# Patient Record
Sex: Female | Born: 1965 | Race: White | Hispanic: No | Smoking: Light tobacco smoker
Health system: Southern US, Community
[De-identification: ages and names within clinical notes are randomized; demographics above are authoritative.]

## PROBLEM LIST (undated history)

## (undated) DIAGNOSIS — K219 Gastro-esophageal reflux disease without esophagitis: Secondary | ICD-10-CM

## (undated) DIAGNOSIS — R079 Chest pain, unspecified: Secondary | ICD-10-CM

## (undated) DIAGNOSIS — I10 Essential (primary) hypertension: Secondary | ICD-10-CM

## (undated) DIAGNOSIS — T4145XA Adverse effect of unspecified anesthetic, initial encounter: Secondary | ICD-10-CM

## (undated) DIAGNOSIS — M1990 Unspecified osteoarthritis, unspecified site: Secondary | ICD-10-CM

## (undated) DIAGNOSIS — J449 Chronic obstructive pulmonary disease, unspecified: Secondary | ICD-10-CM

## (undated) DIAGNOSIS — R0602 Shortness of breath: Secondary | ICD-10-CM

## (undated) DIAGNOSIS — I499 Cardiac arrhythmia, unspecified: Secondary | ICD-10-CM

## (undated) DIAGNOSIS — T8859XA Other complications of anesthesia, initial encounter: Secondary | ICD-10-CM

## (undated) HISTORY — PX: JOINT REPLACEMENT: SHX530

## (undated) HISTORY — PX: TONSILLECTOMY: SUR1361

## (undated) HISTORY — PX: TUBAL LIGATION: SHX77

---

## 2006-03-28 HISTORY — PX: ABDOMINAL HYSTERECTOMY: SHX81

## 2007-11-06 ENCOUNTER — Emergency Department (HOSPITAL_COMMUNITY): Admission: EM | Admit: 2007-11-06 | Discharge: 2007-11-06 | Payer: Self-pay | Admitting: *Deleted

## 2008-08-20 ENCOUNTER — Emergency Department (HOSPITAL_COMMUNITY): Admission: EM | Admit: 2008-08-20 | Discharge: 2008-08-21 | Payer: Self-pay | Admitting: Emergency Medicine

## 2010-04-01 ENCOUNTER — Emergency Department (HOSPITAL_COMMUNITY)
Admission: EM | Admit: 2010-04-01 | Discharge: 2010-04-01 | Payer: Self-pay | Source: Home / Self Care | Admitting: Emergency Medicine

## 2010-07-06 LAB — CBC
HCT: 47.8 % — ABNORMAL HIGH (ref 36.0–46.0)
Hemoglobin: 16.4 g/dL — ABNORMAL HIGH (ref 12.0–15.0)
MCHC: 34.4 g/dL (ref 30.0–36.0)
MCV: 101.3 fL — ABNORMAL HIGH (ref 78.0–100.0)
RDW: 13.3 % (ref 11.5–15.5)

## 2010-07-06 LAB — DIFFERENTIAL
Basophils Absolute: 0.1 10*3/uL (ref 0.0–0.1)
Basophils Relative: 1 % (ref 0–1)
Monocytes Relative: 3 % (ref 3–12)
Neutro Abs: 17.1 10*3/uL — ABNORMAL HIGH (ref 1.7–7.7)
Neutrophils Relative %: 87 % — ABNORMAL HIGH (ref 43–77)

## 2010-07-06 LAB — URINE CULTURE

## 2010-07-06 LAB — URINALYSIS, ROUTINE W REFLEX MICROSCOPIC
Bilirubin Urine: NEGATIVE
Glucose, UA: NEGATIVE mg/dL
Hgb urine dipstick: NEGATIVE
Ketones, ur: NEGATIVE mg/dL
pH: 5.5 (ref 5.0–8.0)

## 2010-07-06 LAB — COMPREHENSIVE METABOLIC PANEL
Alkaline Phosphatase: 76 U/L (ref 39–117)
BUN: 18 mg/dL (ref 6–23)
Calcium: 9.2 mg/dL (ref 8.4–10.5)
Creatinine, Ser: 0.61 mg/dL (ref 0.4–1.2)
Glucose, Bld: 125 mg/dL — ABNORMAL HIGH (ref 70–99)
Potassium: 3.8 mEq/L (ref 3.5–5.1)
Total Protein: 7.6 g/dL (ref 6.0–8.3)

## 2010-08-02 ENCOUNTER — Emergency Department (HOSPITAL_COMMUNITY)
Admission: EM | Admit: 2010-08-02 | Discharge: 2010-08-03 | Disposition: A | Discharge status: Home / Self Care | Payer: BC Managed Care – PPO | Attending: Emergency Medicine | Admitting: Emergency Medicine

## 2010-08-02 ENCOUNTER — Emergency Department (HOSPITAL_COMMUNITY): Payer: BC Managed Care – PPO

## 2010-08-02 DIAGNOSIS — R109 Unspecified abdominal pain: Secondary | ICD-10-CM | POA: Insufficient documentation

## 2010-08-02 DIAGNOSIS — R05 Cough: Secondary | ICD-10-CM | POA: Insufficient documentation

## 2010-08-02 DIAGNOSIS — R110 Nausea: Secondary | ICD-10-CM | POA: Insufficient documentation

## 2010-08-02 DIAGNOSIS — I10 Essential (primary) hypertension: Secondary | ICD-10-CM | POA: Insufficient documentation

## 2010-08-02 DIAGNOSIS — R1012 Left upper quadrant pain: Secondary | ICD-10-CM | POA: Insufficient documentation

## 2010-08-02 DIAGNOSIS — R059 Cough, unspecified: Secondary | ICD-10-CM | POA: Insufficient documentation

## 2010-08-02 DIAGNOSIS — R079 Chest pain, unspecified: Secondary | ICD-10-CM | POA: Insufficient documentation

## 2010-08-02 LAB — DIFFERENTIAL
Eosinophils Relative: 2 % (ref 0–5)
Lymphocytes Relative: 48 % — ABNORMAL HIGH (ref 12–46)
Lymphs Abs: 3.1 10*3/uL (ref 0.7–4.0)
Monocytes Absolute: 0.5 10*3/uL (ref 0.1–1.0)
Monocytes Relative: 8 % (ref 3–12)

## 2010-08-02 LAB — CBC
HCT: 44.1 % (ref 36.0–46.0)
MCHC: 33.6 g/dL (ref 30.0–36.0)
MCV: 98.9 fL (ref 78.0–100.0)
RDW: 12.9 % (ref 11.5–15.5)

## 2010-08-02 LAB — COMPREHENSIVE METABOLIC PANEL
Alkaline Phosphatase: 93 U/L (ref 39–117)
BUN: 13 mg/dL (ref 6–23)
Calcium: 9.6 mg/dL (ref 8.4–10.5)
GFR calc non Af Amer: >60 mL/min (ref >60)
Glucose, Bld: 104 mg/dL — ABNORMAL HIGH (ref 70–99)
Total Protein: 7.3 g/dL (ref 6.0–8.3)

## 2010-08-02 LAB — URINALYSIS, ROUTINE W REFLEX MICROSCOPIC
Bilirubin Urine: NEGATIVE
Glucose, UA: NEGATIVE mg/dL
Ketones, ur: NEGATIVE mg/dL
pH: 6.5 (ref 5.0–8.0)

## 2010-08-02 LAB — LIPASE, BLOOD: Lipase: 24 U/L (ref 11–59)

## 2010-08-03 ENCOUNTER — Encounter (HOSPITAL_COMMUNITY): Payer: Self-pay

## 2010-08-03 ENCOUNTER — Emergency Department (HOSPITAL_COMMUNITY): Payer: BC Managed Care – PPO

## 2010-08-03 MED ORDER — IOHEXOL 300 MG/ML  SOLN
100.0000 mL | Freq: Once | INTRAMUSCULAR | Status: AC | PRN
  Administered 2010-08-03: 100 mL via INTRAVENOUS

## 2013-04-05 ENCOUNTER — Emergency Department (HOSPITAL_COMMUNITY): Payer: BC Managed Care – PPO

## 2013-04-05 ENCOUNTER — Observation Stay (HOSPITAL_COMMUNITY): Payer: BC Managed Care – PPO

## 2013-04-05 ENCOUNTER — Encounter (HOSPITAL_COMMUNITY): Payer: Self-pay | Admitting: Emergency Medicine

## 2013-04-05 ENCOUNTER — Observation Stay (HOSPITAL_COMMUNITY)
Admission: EM | Admit: 2013-04-05 | Discharge: 2013-04-06 | Disposition: A | Discharge status: Home / Self Care | Payer: BC Managed Care – PPO | Attending: Internal Medicine | Admitting: Internal Medicine

## 2013-04-05 DIAGNOSIS — R079 Chest pain, unspecified: Secondary | ICD-10-CM | POA: Diagnosis present

## 2013-04-05 DIAGNOSIS — K219 Gastro-esophageal reflux disease without esophagitis: Secondary | ICD-10-CM

## 2013-04-05 DIAGNOSIS — E079 Disorder of thyroid, unspecified: Secondary | ICD-10-CM | POA: Insufficient documentation

## 2013-04-05 DIAGNOSIS — N289 Disorder of kidney and ureter, unspecified: Secondary | ICD-10-CM | POA: Insufficient documentation

## 2013-04-05 DIAGNOSIS — I2510 Atherosclerotic heart disease of native coronary artery without angina pectoris: Secondary | ICD-10-CM | POA: Insufficient documentation

## 2013-04-05 DIAGNOSIS — E119 Type 2 diabetes mellitus without complications: Secondary | ICD-10-CM | POA: Insufficient documentation

## 2013-04-05 DIAGNOSIS — J4489 Other specified chronic obstructive pulmonary disease: Secondary | ICD-10-CM | POA: Insufficient documentation

## 2013-04-05 DIAGNOSIS — R0789 Other chest pain: Secondary | ICD-10-CM | POA: Diagnosis present

## 2013-04-05 DIAGNOSIS — F17200 Nicotine dependence, unspecified, uncomplicated: Secondary | ICD-10-CM | POA: Insufficient documentation

## 2013-04-05 DIAGNOSIS — Z8673 Personal history of transient ischemic attack (TIA), and cerebral infarction without residual deficits: Secondary | ICD-10-CM | POA: Insufficient documentation

## 2013-04-05 DIAGNOSIS — R0781 Pleurodynia: Secondary | ICD-10-CM

## 2013-04-05 DIAGNOSIS — I509 Heart failure, unspecified: Secondary | ICD-10-CM | POA: Insufficient documentation

## 2013-04-05 DIAGNOSIS — Z859 Personal history of malignant neoplasm, unspecified: Secondary | ICD-10-CM | POA: Insufficient documentation

## 2013-04-05 DIAGNOSIS — Z720 Tobacco use: Secondary | ICD-10-CM

## 2013-04-05 DIAGNOSIS — M129 Arthropathy, unspecified: Secondary | ICD-10-CM | POA: Insufficient documentation

## 2013-04-05 DIAGNOSIS — I10 Essential (primary) hypertension: Secondary | ICD-10-CM

## 2013-04-05 DIAGNOSIS — I499 Cardiac arrhythmia, unspecified: Secondary | ICD-10-CM | POA: Insufficient documentation

## 2013-04-05 DIAGNOSIS — J449 Chronic obstructive pulmonary disease, unspecified: Secondary | ICD-10-CM | POA: Insufficient documentation

## 2013-04-05 DIAGNOSIS — Z91040 Latex allergy status: Secondary | ICD-10-CM | POA: Insufficient documentation

## 2013-04-05 DIAGNOSIS — R071 Chest pain on breathing: Principal | ICD-10-CM | POA: Insufficient documentation

## 2013-04-05 DIAGNOSIS — Z882 Allergy status to sulfonamides status: Secondary | ICD-10-CM | POA: Insufficient documentation

## 2013-04-05 HISTORY — DX: Gastro-esophageal reflux disease without esophagitis: K21.9

## 2013-04-05 HISTORY — DX: Adverse effect of unspecified anesthetic, initial encounter: T41.45XA

## 2013-04-05 HISTORY — DX: Cardiac arrhythmia, unspecified: I49.9

## 2013-04-05 HISTORY — DX: Chronic obstructive pulmonary disease, unspecified: J44.9

## 2013-04-05 HISTORY — DX: Chest pain, unspecified: R07.9

## 2013-04-05 HISTORY — DX: Essential (primary) hypertension: I10

## 2013-04-05 HISTORY — DX: Shortness of breath: R06.02

## 2013-04-05 HISTORY — DX: Unspecified osteoarthritis, unspecified site: M19.90

## 2013-04-05 HISTORY — DX: Other complications of anesthesia, initial encounter: T88.59XA

## 2013-04-05 LAB — CBC
HEMATOCRIT: 39.9 % (ref 36.0–46.0)
Hemoglobin: 13.8 g/dL (ref 12.0–15.0)
MCH: 34.8 pg — ABNORMAL HIGH (ref 26.0–34.0)
MCHC: 34.6 g/dL (ref 30.0–36.0)
MCV: 100.8 fL — AB (ref 78.0–100.0)
Platelets: 166 10*3/uL (ref 150–400)
RBC: 3.96 MIL/uL (ref 3.87–5.11)
RDW: 12.7 % (ref 11.5–15.5)
WBC: 6.9 10*3/uL (ref 4.0–10.5)

## 2013-04-05 LAB — BASIC METABOLIC PANEL
BUN: 14 mg/dL (ref 6–23)
CALCIUM: 9.2 mg/dL (ref 8.4–10.5)
CO2: 25 mEq/L (ref 19–32)
Chloride: 98 mEq/L (ref 96–112)
Creatinine, Ser: 0.43 mg/dL — ABNORMAL LOW (ref 0.50–1.10)
GLUCOSE: 117 mg/dL — AB (ref 70–99)
Potassium: 3.6 mEq/L — ABNORMAL LOW (ref 3.7–5.3)
Sodium: 138 mEq/L (ref 137–147)

## 2013-04-05 LAB — HEPATIC FUNCTION PANEL
ALBUMIN: 3.8 g/dL (ref 3.5–5.2)
ALK PHOS: 69 U/L (ref 39–117)
ALT: 19 U/L (ref 0–35)
AST: 20 U/L (ref 0–37)
Bilirubin, Direct: <0.2 mg/dL (ref 0.0–0.3)
TOTAL PROTEIN: 7.1 g/dL (ref 6.0–8.3)
Total Bilirubin: 0.3 mg/dL (ref 0.3–1.2)

## 2013-04-05 LAB — CBC WITH DIFFERENTIAL/PLATELET
BASOS PCT: 0 % (ref 0–1)
Basophils Absolute: 0 10*3/uL (ref 0.0–0.1)
EOS ABS: 0.2 10*3/uL (ref 0.0–0.7)
EOS PCT: 2 % (ref 0–5)
HCT: 42.5 % (ref 36.0–46.0)
Hemoglobin: 15 g/dL (ref 12.0–15.0)
LYMPHS ABS: 3.3 10*3/uL (ref 0.7–4.0)
Lymphocytes Relative: 41 % (ref 12–46)
MCH: 35 pg — AB (ref 26.0–34.0)
MCHC: 35.3 g/dL (ref 30.0–36.0)
MCV: 99.3 fL (ref 78.0–100.0)
MONOS PCT: 5 % (ref 3–12)
Monocytes Absolute: 0.4 10*3/uL (ref 0.1–1.0)
Neutro Abs: 4.3 10*3/uL (ref 1.7–7.7)
Neutrophils Relative %: 52 % (ref 43–77)
PLATELETS: 197 10*3/uL (ref 150–400)
RBC: 4.28 MIL/uL (ref 3.87–5.11)
RDW: 12.7 % (ref 11.5–15.5)
WBC: 8.2 10*3/uL (ref 4.0–10.5)

## 2013-04-05 LAB — TROPONIN I
Troponin I: <0.3 ng/mL (ref <0.30)
Troponin I: <0.3 ng/mL (ref <0.30)
Troponin I: <0.3 ng/mL (ref <0.30)

## 2013-04-05 LAB — PRO B NATRIURETIC PEPTIDE: Pro B Natriuretic peptide (BNP): 88.6 pg/mL (ref 0–125)

## 2013-04-05 LAB — D-DIMER, QUANTITATIVE: D-Dimer, Quant: 0.3 ug/mL-FEU (ref 0.00–0.48)

## 2013-04-05 LAB — CREATININE, SERUM
Creatinine, Ser: 0.48 mg/dL — ABNORMAL LOW (ref 0.50–1.10)
GFR calc Af Amer: >90 mL/min (ref >90)
GFR calc non Af Amer: >90 mL/min (ref >90)

## 2013-04-05 MED ORDER — ONDANSETRON HCL 4 MG/2ML IJ SOLN
4.0000 mg | Freq: Once | INTRAMUSCULAR | Status: AC
  Administered 2013-04-05: 4 mg via INTRAVENOUS
  Filled 2013-04-05: qty 2

## 2013-04-05 MED ORDER — ONDANSETRON HCL 4 MG/2ML IJ SOLN: 4.0000 mg | Freq: Three times a day (TID) | INTRAMUSCULAR | Status: DC | PRN

## 2013-04-05 MED ORDER — ATENOLOL 100 MG PO TABS
100.0000 mg | ORAL_TABLET | Freq: Every day | ORAL | Status: DC
  Administered 2013-04-05 – 2013-04-06 (×2): 100 mg via ORAL
  Filled 2013-04-05 (×2): qty 1

## 2013-04-05 MED ORDER — GI COCKTAIL ~~LOC~~
30.0000 mL | Freq: Once | ORAL | Status: AC
  Administered 2013-04-05: 30 mL via ORAL
  Filled 2013-04-05: qty 30

## 2013-04-05 MED ORDER — ONDANSETRON HCL 4 MG PO TABS
4.0000 mg | ORAL_TABLET | Freq: Four times a day (QID) | ORAL | Status: DC | PRN
Start: 2013-04-05 — End: 2013-04-06

## 2013-04-05 MED ORDER — ONDANSETRON HCL 4 MG/2ML IJ SOLN: 4.0000 mg | Freq: Four times a day (QID) | INTRAMUSCULAR | Status: DC | PRN

## 2013-04-05 MED ORDER — LISINOPRIL 10 MG PO TABS
10.0000 mg | ORAL_TABLET | Freq: Every day | ORAL | Status: DC
  Administered 2013-04-05 – 2013-04-06 (×2): 10 mg via ORAL
  Filled 2013-04-05 (×2): qty 1

## 2013-04-05 MED ORDER — ATENOLOL-CHLORTHALIDONE 100-25 MG PO TABS: 1.0000 | ORAL_TABLET | Freq: Every day | ORAL | Status: DC

## 2013-04-05 MED ORDER — IOHEXOL 350 MG/ML SOLN
75.0000 mL | Freq: Once | INTRAVENOUS | Status: AC | PRN
  Administered 2013-04-05: 75 mL via INTRAVENOUS

## 2013-04-05 MED ORDER — MORPHINE SULFATE 4 MG/ML IJ SOLN
4.0000 mg | Freq: Once | INTRAMUSCULAR | Status: AC
Start: 2013-04-05 — End: 2013-04-05
  Administered 2013-04-05: 4 mg via INTRAVENOUS
  Filled 2013-04-05: qty 1

## 2013-04-05 MED ORDER — PANTOPRAZOLE SODIUM 40 MG PO TBEC
40.0000 mg | DELAYED_RELEASE_TABLET | Freq: Every day | ORAL | Status: DC
  Administered 2013-04-05 – 2013-04-06 (×2): 40 mg via ORAL
  Filled 2013-04-05 (×2): qty 1

## 2013-04-05 MED ORDER — POLYETHYLENE GLYCOL 3350 17 G PO PACK
17.0000 g | PACK | Freq: Every day | ORAL | Status: DC | PRN
  Administered 2013-04-06: 17 g via ORAL
  Filled 2013-04-05: qty 1

## 2013-04-05 MED ORDER — HYDROMORPHONE HCL PF 1 MG/ML IJ SOLN: 1.0000 mg | INTRAMUSCULAR | Status: DC | PRN

## 2013-04-05 MED ORDER — MORPHINE SULFATE 2 MG/ML IJ SOLN
2.0000 mg | INTRAMUSCULAR | Status: DC | PRN
  Administered 2013-04-05 – 2013-04-06 (×4): 2 mg via INTRAVENOUS
  Filled 2013-04-05 (×4): qty 1

## 2013-04-05 MED ORDER — MORPHINE SULFATE 4 MG/ML IJ SOLN
4.0000 mg | Freq: Once | INTRAMUSCULAR | Status: AC
  Administered 2013-04-05: 4 mg via INTRAVENOUS
  Filled 2013-04-05: qty 1

## 2013-04-05 MED ORDER — NITROGLYCERIN 0.4 MG SL SUBL
0.4000 mg | SUBLINGUAL_TABLET | SUBLINGUAL | Status: DC | PRN
  Administered 2013-04-05 (×2): 0.4 mg via SUBLINGUAL

## 2013-04-05 MED ORDER — CYCLOBENZAPRINE HCL 5 MG PO TABS
5.0000 mg | ORAL_TABLET | Freq: Three times a day (TID) | ORAL | Status: DC | PRN
  Filled 2013-04-05: qty 1

## 2013-04-05 MED ORDER — HYDROMORPHONE HCL PF 1 MG/ML IJ SOLN
1.0000 mg | Freq: Once | INTRAMUSCULAR | Status: AC
  Administered 2013-04-05: 1 mg via INTRAVENOUS
  Filled 2013-04-05: qty 1

## 2013-04-05 MED ORDER — HYDROCHLOROTHIAZIDE 25 MG PO TABS
25.0000 mg | ORAL_TABLET | Freq: Every day | ORAL | Status: DC
  Administered 2013-04-05 – 2013-04-06 (×2): 25 mg via ORAL
  Filled 2013-04-05 (×3): qty 1

## 2013-04-05 MED ORDER — HEPARIN SODIUM (PORCINE) 5000 UNIT/ML IJ SOLN
5000.0000 [IU] | Freq: Three times a day (TID) | INTRAMUSCULAR | Status: DC
  Administered 2013-04-05 – 2013-04-06 (×2): 5000 [IU] via SUBCUTANEOUS
  Filled 2013-04-05 (×6): qty 1

## 2013-04-05 NOTE — ED Notes (Signed)
Pt brought in by EMS, given 325 ASA and 1 Nitro in route, Pt c/o chest pain that started last night and progressively got worse, Pt has IV in left hand by EMS,

## 2013-04-05 NOTE — ED Notes (Signed)
Pt denies chest pain, pt is reporting left flank pain.  Pt rates the pain in the left flank 5/10.

## 2013-04-05 NOTE — ED Provider Notes (Signed)
CSN: [id number]     Arrival date & time 04/05/13  0253 History   First MD Initiated Contact with Patient 04/05/13 [id number]     Chief Complaint  Patient presents with  . Chest Pain   (Consider location/radiation/quality/duration/timing/severity/associated sxs/prior Treatment) Patient is a 48 y.o. female presenting with chest pain. The history is provided by the patient.  Chest Pain She came in with onset about 2:30 AM of a crushing anterior chest pain without radiation. This associated with dyspnea nausea but no diaphoresis. She came in by ambulance and thinks the nausea may have been due to motion sickness. She was given aspirin and nitroglycerin with some relief of pain. She rated the pain a 10/10 initially, but is down to 6/10 now. She also states that for the last 5 days, she's been having a sharp pain in her left lateral chest with some radiation to the left scapular area the pain has been getting gradually worse. Pain has been constant although she did get some relief with ibuprofen. The pain is worse with deep breath and also worse with some movements denies any trauma or unusual bending, lifting, et Karie Soda. She denies fever, chills. She denies cough. She is a cigarette smoker and has history of hypertension but denies history of diabetes or hyperlipidemia. Both parents had heart disease with mother's heart disease onset in her 82s, father's heart disease onset in his 23s.  Past Medical History  Diagnosis Date  . Arthritis   . COPD (chronic obstructive pulmonary disease)   . Hypertension    Past Surgical History  Procedure Laterality Date  . Tonsillectomy    . Joint replacement     History reviewed. No pertinent family history. History  Substance Use Topics  . Smoking status: Light Tobacco Smoker  . Smokeless tobacco: Never Used  . Alcohol Use: Yes   OB History   Grav Para Term Preterm Abortions TAB SAB Ect Mult Living                 Review of Systems  Cardiovascular: Positive  for chest pain.  All other systems reviewed and are negative.    Allergies  Latex and Sulfa antibiotics  Home Medications  No current outpatient prescriptions on file. Temp(Src) 98 F (36.7 C)  Ht 5\' 3"  (1.6 m)  Wt 205 lb (92.987 kg)  BMI 36.32 kg/m2  SpO2 97% Physical Exam  Nursing note and vitals reviewed.  48 year old female, resting comfortably and in no acute distress. Vital signs are normal. Oxygen saturation is 97%, which is normal. Head is normocephalic and atraumatic. PERRLA, EOMI. Oropharynx is clear. Neck is nontender and supple without adenopathy or JVD. Back is nontender and there is no CVA tenderness. Lungs are clear without rales, wheezes, or rhonchi. Chest is nontender. Heart has regular rate and rhythm without murmur. Abdomen is soft, flat, nontender without masses or hepatosplenomegaly and peristalsis is normoactive. Extremities have no cyanosis or edema, full range of motion is present. Skin is warm and dry without rash. Neurologic: Mental status is normal, cranial nerves are intact, there are no motor or sensory deficits.  ED Course  Procedures (including critical care time) Labs Review Results for orders placed during the hospital encounter of 04/05/13  CBC WITH DIFFERENTIAL      Result Value Range   WBC 8.2  4.0 - 10.5 K/uL   RBC 4.28  3.87 - 5.11 MIL/uL   Hemoglobin 15.0  12.0 - 15.0 g/dL   HCT 11.9  14.7 -  46.0 %   MCV 99.3  78.0 - 100.0 fL   MCH 35.0 (*) 26.0 - 34.0 pg   MCHC 35.3  30.0 - 36.0 g/dL   RDW 16.1  09.6 - 04.5 %   Platelets 197  150 - 400 K/uL   Neutrophils Relative % 52  43 - 77 %   Neutro Abs 4.3  1.7 - 7.7 K/uL   Lymphocytes Relative 41  12 - 46 %   Lymphs Abs 3.3  0.7 - 4.0 K/uL   Monocytes Relative 5  3 - 12 %   Monocytes Absolute 0.4  0.1 - 1.0 K/uL   Eosinophils Relative 2  0 - 5 %   Eosinophils Absolute 0.2  0.0 - 0.7 K/uL   Basophils Relative 0  0 - 1 %   Basophils Absolute 0.0  0.0 - 0.1 K/uL  BASIC METABOLIC PANEL       Result Value Range   Sodium 138  137 - 147 mEq/L   Potassium 3.6 (*) 3.7 - 5.3 mEq/L   Chloride 98  96 - 112 mEq/L   CO2 25  19 - 32 mEq/L   Glucose, Bld 117 (*) 70 - 99 mg/dL   BUN 14  6 - 23 mg/dL   Creatinine, Ser 4.09 (*) 0.50 - 1.10 mg/dL   Calcium 9.2  8.4 - 81.1 mg/dL   GFR calc non Af Amer >90  >90 mL/min   GFR calc Af Amer >90  >90 mL/min  PRO B NATRIURETIC PEPTIDE      Result Value Range   Pro B Natriuretic peptide (BNP) 88.6  0 - 125 pg/mL  D-DIMER, QUANTITATIVE      Result Value Range   D-Dimer, Quant 0.30  0.00 - 0.48 ug/mL-FEU  TROPONIN I      Result Value Range   Troponin I <0.30  <0.30 ng/mL   Imaging Review Dg Chest Port 1 View  04/05/2013   CLINICAL DATA:  Shortness of breath and chest pain  EXAM: PORTABLE CHEST - 1 VIEW  COMPARISON:  08/02/2010  FINDINGS: No cardiomegaly. Prominence of the ascending contour which may be from slight rightward rotation. No edema or consolidation. No effusion or pneumothorax. No acute osseous findings  IMPRESSION: No active disease.   Electronically Signed   By: Tiburcio Pea M.D.   On: 04/05/2013 03:41    ECG at 03:07 shows sinus tachycardia with a rate of 101, no ectopy. Normal axis. Normal P wave. Normal QRS. Normal intervals. Normal ST and T waves. Impression: Sinus tachycardia, otherwise normal ECG. No prior ECG available for comparison.  EKG Interpretation    Date/Time:  Friday April 05 2013 05:42:54 EST Ventricular Rate:  84 PR Interval:  149 QRS Duration: 90 QT Interval:  377 QTC Calculation: 446 R Axis:   7 Text Interpretation:  Sinus rhythm Normal ECG No significant change since last tracing Confirmed by [facility]  MD, Adalin Vanderploeg ([id number]) on 04/05/2013 5:52:00 AM             MDM   1. Chest pain    Chest pain of uncertain cause. She seems to have 2 distinct pains. She needs to be evaluated for cardiac disease even though her initial ECG is normal. Also needs to be screened for pulmonary emboli. In the meantime,  she is given symptomatic treatment of her pain and nausea with morphine and ondansetron. Old records are reviewed and she has no prior relevant ED visits and no prior ECGs.  Workup is  negative including normal d-dimer and normal troponin. ECG was repeated and shows no change. She did not get pain relief with 2 doses of morphine but did get relief with a dose of hydromorphone. Cause of her pain is not clear at this time and she will be admitted for further evaluation. Case is discussed with Dr. Conley RollsLe of [facility] who requests that a CT angiogram be obtained to make sure there is not any dissection and make sure there's not any pulmonary embolism in spite of a normal d-dimer. He does accept the patient for admission.  Dione Boozeavid Camie Hauss, MD 04/05/13 [phone]

## 2013-04-05 NOTE — ED Notes (Signed)
Cala BradfordKimberly Floor RN

## 2013-04-05 NOTE — H&P (Signed)
[facility] History and Physical  Melissa Bolsternita Graves Ortman ZOX:[id number] DOB: 1965/07/07 DOA: 04/05/2013  Referring physician: EDP PCP: No primary provider on file.   Chief Complaint: chest pain  HPI: Melissa Campos is a 48 y.o. female with pmh of hypertension and reflux who presents today with left sided chest pain.  She reports that for over a week she has had a pain in the left ribs, starting under the left shoulder blade and radiating anound to under the left breast.  The pain has been throbbing, constant, worse with movement of the left arm/chest. She has had some sensation of tingling going into the left hand.  She has been taking ibuprofen for this pain, which did help to relieve symptoms and she planned to see her PCP on Saturday.  On the night of admission she awoke from sleep with a crushing central chest pain, diaphoresis and nausea.  She did not vomit and she did not have shortness of breath.  This pain was much more severe and did not improve until she was given multiple doses of morphine and dilaudid in the ED.  Currently she is on the floor and is comfortable. The central chest pain is down to a 2/10 and the left chest wall pain is minimal.   Review of Systems:  Constitutional: No weight loss, night sweats, Fevers, chills, fatigue.  HEENT: No headaches, Difficulty swallowing,Tooth/dental problems,Sore throat, No sneezing, itching, ear ache, nasal congestion, post nasal drip,  Cardio-vascular: No chest pain, Orthopnea, PND, swelling in lower extremities, anasarca, dizziness, palpitations  GI: No heartburn, does have indigestion, no abdominal pain, nausea, vomiting, diarrhea, change in bowel habits, loss of appetite  Resp: No shortness of breath with exertion or at rest. No excess mucus, no productive cough, No non-productive cough, No coughing up of blood.No change in color of mucus.No wheezing.No chest wall deformity  Skin: no rash or lesions.  GU: no dysuria, change in color  of urine, no urgency or frequency. No flank pain.  Musculoskeletal: No joint pain or swelling. No decreased range of motion. No back pain.  Psych: No change in mood or affect. No depression or anxiety. No memory loss.   Past Medical History  Diagnosis Date  . Arthritis   . COPD (chronic obstructive pulmonary disease)   . Hypertension    Past Surgical History  Procedure Laterality Date  . Tonsillectomy    . Joint replacement     Social History:  reports that she has been smoking.  She has never used smokeless tobacco. She reports that she drinks alcohol. She reports that she does not use illicit drugs.  Allergies  Allergen Reactions  . Keflex [Cephalexin]     hives  . Latex   . Rocephin [Ceftriaxone]     hives  . Sulfa Antibiotics     History reviewed. No pertinent family history.   Prior to Admission medications   Medication Sig Start Date End Date Taking? Authorizing Provider  atenolol-chlorthalidone (TENORETIC) 100-25 MG per tablet 1 tablet daily.  03/26/13  Yes Historical Provider, MD  doxylamine, Sleep, (UNISOM) 25 MG tablet Take 25 mg by mouth at bedtime as needed.   Yes Historical Provider, MD  famotidine (PEPCID) 20 MG tablet Take 20 mg by mouth daily.   Yes Historical Provider, MD  ibuprofen (ADVIL,MOTRIN) 200 MG tablet Take 400-800 mg by mouth every 8 (eight) hours as needed for fever, mild pain or moderate pain.   Yes Historical Provider, MD  lisinopril (PRINIVIL,ZESTRIL) 10 MG tablet  Take 10 mg by mouth daily.  03/29/13  Yes Historical Provider, MD  Omega-3 Fatty Acids (FISH OIL PO) Take 1 capsule by mouth daily.   Yes Historical Provider, MD  PROAIR HFA 108 (90 BASE) MCG/ACT inhaler Inhale 2 puffs into the lungs every 4 (four) hours as needed.  02/27/13  Yes Historical Provider, MD   Physical Exam: Filed Vitals:   04/05/13 0816  BP: 137/72  Pulse: 81  Temp: 97.6 F (36.4 C)  Resp: 14    BP 137/72  Pulse 81  Temp(Src) 97.6 F (36.4 C) (Oral)  Resp 14  Ht  5\' 3"  (1.6 m)  Wt 95.9 kg (211 lb 6.7 oz)  BMI 37.46 kg/m2  SpO2 98%  General:  Appears calm and comfortable Eyes: PERRL, normal lids, irises & conjunctiva ENT: grossly normal hearing, lips & tongue Neck: no LAD, masses or thyromegaly Cardiovascular: RRR, no m/r/g. No LE edema. Telemetry: SR, no arrhythmias  Respiratory: CTA bilaterally, no w/r/r. Normal respiratory effort. Abdomen: soft, ntnd Skin: no rash or induration seen on limited exam Musculoskeletal: grossly normal tone BUE/BLE Psychiatric: grossly normal mood and affect, speech fluent and appropriate Neurologic: grossly non-focal.          Labs on Admission:  Basic Metabolic Panel:  Recent Labs Lab 04/05/13 0327  NA 138  K 3.6*  CL 98  CO2 25  GLUCOSE 117*  BUN 14  CREATININE 0.43*  CALCIUM 9.2   Liver Function Tests:  Recent Labs Lab 04/05/13 0327  AST 20  ALT 19  ALKPHOS 69  BILITOT 0.3  PROT 7.1  ALBUMIN 3.8   No results found for this basename: LIPASE, AMYLASE,  in the last 168 hours No results found for this basename: AMMONIA,  in the last 168 hours CBC:  Recent Labs Lab 04/05/13 0327  WBC 8.2  NEUTROABS 4.3  HGB 15.0  HCT 42.5  MCV 99.3  PLT 197   Cardiac Enzymes:  Recent Labs Lab 04/05/13 0327  TROPONINI <0.30    BNP (last 3 results)  Recent Labs  04/05/13 0327  PROBNP 88.6   CBG: No results found for this basename: GLUCAP,  in the last 168 hours  Radiological Exams on Admission: Ct Angio Chest Pe W/cm &/or Wo Cm  04/05/2013   CLINICAL DATA:  Intermittent chest pain. Nausea and vomiting. Diaphoresis and dyspnea. History of COPD and hypertension.  EXAM: CT ANGIOGRAPHY CHEST WITH CONTRAST  TECHNIQUE: Multidetector CT imaging of the chest was performed using the standard protocol during bolus administration of intravenous contrast. Multiplanar CT image reconstructions including MIPs were obtained to evaluate the vascular anatomy.  CONTRAST:  75mL OMNIPAQUE IOHEXOL 350 MG/ML  SOLN  COMPARISON:  DG CHEST 1V PORT dated 04/05/2013; DG CHEST 2 VIEW dated 08/02/2010; CTA-CHEST- NON-CORONARY dated 12/19/2004  FINDINGS: Lungs/Pleura: Mild paraseptal emphysema. Clear lungs. Lower lobe predominant bronchial wall thickening. No pleural fluid.  Heart/Mediastinum: The quality of this examination for evaluation of pulmonary embolism is moderate. The bolus is suboptimally timed, with contrast centered in the thoracic aorta. No pulmonary embolism to the segmental level. Smaller emboli cannot be excluded.  Age advanced aortic and branch vessel atherosclerosis. Normal heart size, without pericardial effusion. No mediastinal or hilar adenopathy. Fluid level in the thoracic esophagus on image 40/series 4.  Upper Abdomen:  No significant findings.  Bones/Musculoskeletal:  No acute osseous abnormality.  Review of the MIP images confirms the above findings.  IMPRESSION: 1. Suboptimal bolus timing. No evidence of pulmonary embolism to the large  segmental level. 2. Paraseptal emphysema with bronchial wall thickening, likely related to smoking. 3. Age advanced atherosclerosis. Consider cardiac/coronary source for patient's chest pain. 4. Esophageal air fluid level suggests dysmotility or gastroesophageal reflux.   Electronically Signed   By: Jeronimo Greaves M.D.   On: 04/05/2013 07:23   Dg Chest Port 1 View  04/05/2013   CLINICAL DATA:  Shortness of breath and chest pain  EXAM: PORTABLE CHEST - 1 VIEW  COMPARISON:  08/02/2010  FINDINGS: No cardiomegaly. Prominence of the ascending contour which may be from slight rightward rotation. No edema or consolidation. No effusion or pneumothorax. No acute osseous findings  IMPRESSION: No active disease.   Electronically Signed   By: Tiburcio Pea M.D.   On: 04/05/2013 03:41    EKG: NSR no change  Assessment/Plan Active Problems:   Chest pain   GERD (gastroesophageal reflux disease)   Tobacco abuse   Rib pain   Essential hypertension, benign   1. Central chest  pain: etiology unclear.  With recent NSIAD use and history of GERD and reflux visualized on CTA I favor GERD as the cause. Will give GI cocktail.  Will also cycle cardiac enzymes and monitor on tele.  She has risk factors of smoking, HTN.  So far Troponins negative and EKG unchanged. 2. Chest wall pain: There is no skin change to suggest VZV.  This is likely musculoskeletal. Will start low dose flexaryl. CTA with no PE (study was poorly timed) d-dimer negative.  CTA showed no other abnormality to explain this symptom. 3. GERD: PPI 4. Tobacco abuse: counseled regarding cessation 5. HTN: continue home regimen 6. Megaloblast: check B12 level. Will need to discuss ETOH use again 7.   Code Status: full Family Communication: spoke with patient Disposition Plan: observation for tonight  Time spent: 45 minutes  [facility] Pager [phone]

## 2013-04-06 DIAGNOSIS — R0789 Other chest pain: Secondary | ICD-10-CM

## 2013-04-06 DIAGNOSIS — K219 Gastro-esophageal reflux disease without esophagitis: Secondary | ICD-10-CM

## 2013-04-06 DIAGNOSIS — I10 Essential (primary) hypertension: Secondary | ICD-10-CM

## 2013-04-06 DIAGNOSIS — R079 Chest pain, unspecified: Secondary | ICD-10-CM

## 2013-04-06 DIAGNOSIS — F17200 Nicotine dependence, unspecified, uncomplicated: Secondary | ICD-10-CM

## 2013-04-06 LAB — BASIC METABOLIC PANEL
BUN: 11 mg/dL (ref 6–23)
CO2: 26 meq/L (ref 19–32)
Calcium: 8.8 mg/dL (ref 8.4–10.5)
Chloride: 98 mEq/L (ref 96–112)
Creatinine, Ser: 0.56 mg/dL (ref 0.50–1.10)
GFR calc Af Amer: >90 mL/min (ref >90)
Glucose, Bld: 97 mg/dL (ref 70–99)
Potassium: 4.2 mEq/L (ref 3.7–5.3)
SODIUM: 138 meq/L (ref 137–147)

## 2013-04-06 LAB — CBC
HCT: 38.3 % (ref 36.0–46.0)
Hemoglobin: 12.9 g/dL (ref 12.0–15.0)
MCH: 33.9 pg (ref 26.0–34.0)
MCHC: 33.7 g/dL (ref 30.0–36.0)
MCV: 100.8 fL — ABNORMAL HIGH (ref 78.0–100.0)
PLATELETS: 169 10*3/uL (ref 150–400)
RBC: 3.8 MIL/uL — AB (ref 3.87–5.11)
RDW: 12.6 % (ref 11.5–15.5)
WBC: 7 10*3/uL (ref 4.0–10.5)

## 2013-04-06 LAB — VITAMIN B12: VITAMIN B 12: 542 pg/mL (ref 211–911)

## 2013-04-06 MED ORDER — IBUPROFEN 200 MG PO TABS: 400.0000 mg | ORAL_TABLET | Freq: Three times a day (TID) | ORAL | Status: AC | PRN

## 2013-04-06 MED ORDER — PANTOPRAZOLE SODIUM 40 MG PO TBEC: 40.0000 mg | DELAYED_RELEASE_TABLET | Freq: Every day | ORAL | Status: DC

## 2013-04-06 MED ORDER — PANTOPRAZOLE SODIUM 40 MG PO TBEC: 40.0000 mg | DELAYED_RELEASE_TABLET | Freq: Every day | ORAL | Status: AC

## 2013-04-06 MED ORDER — CYCLOBENZAPRINE HCL 5 MG PO TABS: 5.0000 mg | ORAL_TABLET | Freq: Three times a day (TID) | ORAL | Status: AC | PRN

## 2013-04-06 MED ORDER — DOCUSATE SODIUM 100 MG PO CAPS
100.0000 mg | ORAL_CAPSULE | Freq: Two times a day (BID) | ORAL | Status: DC
  Administered 2013-04-06: 100 mg via ORAL
  Filled 2013-04-06 (×2): qty 1

## 2013-04-06 MED ORDER — CYCLOBENZAPRINE HCL 5 MG PO TABS: 5.0000 mg | ORAL_TABLET | Freq: Three times a day (TID) | ORAL | Status: DC | PRN

## 2013-04-06 NOTE — Discharge Summary (Signed)
Physician Discharge Summary  Morrie Sheldonnita Graves Heffern HQI:[id number] DOB: 04-Aug-1965 DOA: 04/05/2013  Admit date: 04/05/2013 Discharge date: 04/06/2013   Discharge Diagnoses:  Active Problems:   Chest pain, likely musculoskeletal   GERD (gastroesophageal reflux disease)   Tobacco abuse   Rib pain   Essential hypertension, benign  Follow-up Information   Follow up with Millsaps, Joelene MillinKIMBERLY M, NP In 3 weeks.   Contact information:   [facility] [address] [place] [address] [phone]      Discharge Condition: stable  Filed Weights   04/05/13 0311 04/05/13 0816  Weight: 92.987 kg (205 lb) 95.9 kg (211 lb 6.7 oz)    History of present illness:  48 y.o. female with pmh of hypertension and reflux who presents today with left sided chest pain. She reports that for over a week she has had a pain in the left ribs, starting under the left shoulder blade and radiating anound to under the left breast. The pain has been throbbing, constant, worse with movement of the left arm/chest. She has had some sensation of tingling going into the left hand. She has been taking ibuprofen for this pain, which did help to relieve symptoms and she planned to see her PCP on Saturday. On the night of admission she awoke from sleep with a crushing central chest pain, diaphoresis and nausea. She did not vomit and she did not have shortness of breath. This pain was much more severe and did not improve until she was given multiple doses of morphine and dilaudid in the ED. Currently she is on the floor and is comfortable. The central chest pain is down to a 2/10 and the left chest wall pain is minimal.   Hospital Course:  Observed on telemetry. MI and PE ruled out.  Chest pain improved with nsaids. Also, pepcid changed to Protonix.  Procedures:  none  Consultations:  none  Discharge Exam: Filed Vitals:   04/06/13 0500  BP: 110/64  Pulse: 70  Temp: 98 F (36.7 C)  Resp: 18     General: comfortable Cardiovascular: RRR Respiratory: CTA  Discharge Instructions  Discharge Orders   Future Orders Complete By Expires   Diet - low sodium heart healthy  As directed    Increase activity slowly  As directed        Medication List    STOP taking these medications       famotidine 20 MG tablet  Commonly known as:  PEPCID      TAKE these medications       atenolol-chlorthalidone 100-25 MG per tablet  Commonly known as:  TENORETIC  1 tablet daily.     cyclobenzaprine 5 MG tablet  Commonly known as:  FLEXERIL  Take 1-2 tablets (5-10 mg total) by mouth 3 (three) times daily as needed for muscle spasms.     doxylamine (Sleep) 25 MG tablet  Commonly known as:  UNISOM  Take 25 mg by mouth at bedtime as needed.     FISH OIL PO  Take 1 capsule by mouth daily.     ibuprofen 200 MG tablet  Commonly known as:  ADVIL,MOTRIN  Take 2 tablets (400 mg total) by mouth every 8 (eight) hours as needed for fever, mild pain or moderate pain.     lisinopril 10 MG tablet  Commonly known as:  PRINIVIL,ZESTRIL  Take 10 mg by mouth daily.     pantoprazole 40 MG tablet  Commonly known as:  PROTONIX  Take 1  tablet (40 mg total) by mouth daily.     PROAIR HFA 108 (90 BASE) MCG/ACT inhaler  Generic drug:  albuterol  Inhale 2 puffs into the lungs every 4 (four) hours as needed.       Allergies  Allergen Reactions  . Keflex [Cephalexin]     hives  . Latex   . Rocephin [Ceftriaxone]     hives  . Sulfa Antibiotics       The results of significant diagnostics from this hospitalization (including imaging, microbiology, ancillary and laboratory) are listed below for reference.    Significant Diagnostic Studies: Ct Angio Chest Pe W/cm &/or Wo Cm  04/05/2013   CLINICAL DATA:  Intermittent chest pain. Nausea and vomiting. Diaphoresis and dyspnea. History of COPD and hypertension.  EXAM: CT ANGIOGRAPHY CHEST WITH CONTRAST  TECHNIQUE: Multidetector CT imaging of the  chest was performed using the standard protocol during bolus administration of intravenous contrast. Multiplanar CT image reconstructions including MIPs were obtained to evaluate the vascular anatomy.  CONTRAST:  75mL OMNIPAQUE IOHEXOL 350 MG/ML SOLN  COMPARISON:  DG CHEST 1V PORT dated 04/05/2013; DG CHEST 2 VIEW dated 08/02/2010; CTA-CHEST- NON-CORONARY dated 12/19/2004  FINDINGS: Lungs/Pleura: Mild paraseptal emphysema. Clear lungs. Lower lobe predominant bronchial wall thickening. No pleural fluid.  Heart/Mediastinum: The quality of this examination for evaluation of pulmonary embolism is moderate. The bolus is suboptimally timed, with contrast centered in the thoracic aorta. No pulmonary embolism to the segmental level. Smaller emboli cannot be excluded.  Age advanced aortic and branch vessel atherosclerosis. Normal heart size, without pericardial effusion. No mediastinal or hilar adenopathy. Fluid level in the thoracic esophagus on image 40/series 4.  Upper Abdomen:  No significant findings.  Bones/Musculoskeletal:  No acute osseous abnormality.  Review of the MIP images confirms the above findings.  IMPRESSION: 1. Suboptimal bolus timing. No evidence of pulmonary embolism to the large segmental level. 2. Paraseptal emphysema with bronchial wall thickening, likely related to smoking. 3. Age advanced atherosclerosis. Consider cardiac/coronary source for patient's chest pain. 4. Esophageal air fluid level suggests dysmotility or gastroesophageal reflux.   Electronically Signed   By: Jeronimo Greaves M.D.   On: 04/05/2013 07:23   Dg Chest Port 1 View  04/05/2013   CLINICAL DATA:  Shortness of breath and chest pain  EXAM: PORTABLE CHEST - 1 VIEW  COMPARISON:  08/02/2010  FINDINGS: No cardiomegaly. Prominence of the ascending contour which may be from slight rightward rotation. No edema or consolidation. No effusion or pneumothorax. No acute osseous findings  IMPRESSION: No active disease.   Electronically Signed   By:  Tiburcio Pea M.D.   On: 04/05/2013 03:41   ekg  48 y.o. female with pmh of hypertension and reflux who presents today with left sided chest pain. She reports that for over a week she has had a pain in the left ribs, starting under the left shoulder blade and radiating anound to under the left breast. The pain has been throbbing, constant, worse with movement of the left arm/chest. She has had some sensation of tingling going into the left hand. She has been taking ibuprofen for this pain, which did help to relieve symptoms and she planned to see her PCP on Saturday. On the night of admission she awoke from sleep with a crushing central chest pain, diaphoresis and nausea. She did not vomit and she did not have shortness of breath. This pain was much more severe and did not improve until she was given multiple doses of  morphine and dilaudid in the ED. Currently she is on the floor and is comfortable. The central chest pain is down to a 2/10 and the left chest wall pain is minimal.   Microbiology: No results found for this or any previous visit (from the past 240 hour(s)).   Labs: Basic Metabolic Panel:  Recent Labs Lab 04/05/13 0327 04/05/13 1115 04/06/13 0437  NA 138  --  138  K 3.6*  --  4.2  CL 98  --  98  CO2 25  --  26  GLUCOSE 117*  --  97  BUN 14  --  11  CREATININE 0.43* 0.48* 0.56  CALCIUM 9.2  --  8.8   Liver Function Tests:  Recent Labs Lab 04/05/13 0327  AST 20  ALT 19  ALKPHOS 69  BILITOT 0.3  PROT 7.1  ALBUMIN 3.8   No results found for this basename: LIPASE, AMYLASE,  in the last 168 hours No results found for this basename: AMMONIA,  in the last 168 hours CBC:  Recent Labs Lab 04/05/13 0327 04/05/13 1115 04/06/13 0437  WBC 8.2 6.9 7.0  NEUTROABS 4.3  --   --   HGB 15.0 13.8 12.9  HCT 42.5 39.9 38.3  MCV 99.3 100.8* 100.8*  PLT 197 166 169   Cardiac Enzymes:  Recent Labs Lab 04/05/13 0327 04/05/13 1115 04/05/13 1630 04/05/13 2220   TROPONINI <0.30 <0.30 <0.30 <0.30   BNP: BNP (last 3 results)  Recent Labs  04/05/13 0327  PROBNP 88.6   CBG: No results found for this basename: GLUCAP,  in the last 168 hours     Signed:  Lavaun Greenfield L  [facility] 04/06/2013, 10:09 AM

## 2013-04-06 NOTE — Progress Notes (Signed)
[facility]  [address] [id number] [facility] [place] [id number] Phone: [phone]  April 06, 2013  Patient: Melissa Bolsternita Graves Pacitti  Date of Birth: May 04, 1965  Date of Visit: 04/05/2013    To Whom It May Concern:  Orie Routnita Tracz was seen and treated in our emergency department on 04/05/2013. Melissa Campos  may return to work on 04/09/13.  Sincerely,     Crista Curborinna Peyten Weare, M.D.

## 2014-06-11 ENCOUNTER — Other Ambulatory Visit: Payer: Self-pay

## 2014-06-11 DIAGNOSIS — N6320 Unspecified lump in the left breast, unspecified quadrant: Secondary | ICD-10-CM

## 2014-06-17 ENCOUNTER — Other Ambulatory Visit: Payer: Self-pay | Admitting: *Deleted

## 2014-06-17 ENCOUNTER — Other Ambulatory Visit: Payer: Self-pay

## 2014-06-17 DIAGNOSIS — N644 Mastodynia: Secondary | ICD-10-CM

## 2014-06-24 ENCOUNTER — Ambulatory Visit
Admission: RE | Admit: 2014-06-24 | Discharge: 2014-06-24 | Disposition: A | Discharge status: Home / Self Care | Payer: PRIVATE HEALTH INSURANCE | Source: Ambulatory Visit | Attending: *Deleted | Admitting: *Deleted

## 2014-06-24 DIAGNOSIS — N644 Mastodynia: Secondary | ICD-10-CM

## 2014-10-16 IMAGING — CT CT ANGIO CHEST
2 of 9 series · 19 of 46 positions shown · IV contrast (APPLIED)
Comparison: DG CHEST 1V PORT dated 04/05/2013; DG CHEST 2 VIEW dated
08/02/2010; CTA-CHEST- NON-CORONARY dated 12/19/2004

CLINICAL DATA: Intermittent chest pain. Nausea and vomiting.
Diaphoresis and dyspnea. History of COPD and hypertension.

EXAM:
CT ANGIOGRAPHY CHEST WITH CONTRAST
TECHNIQUE: Multidetector CT imaging of the chest was performed using the
standard protocol during bolus administration of intravenous
contrast. Multiplanar CT image reconstructions including MIPs were
obtained to evaluate the vascular anatomy.
CONTRAST:  75mL OMNIPAQUE IOHEXOL 350 MG/ML SOLN

[Series 5: thins · axial · 0.70mm/px · z∈[-780,-523]mm · 16 of 291 slices shown]
[im 17/291  lung]
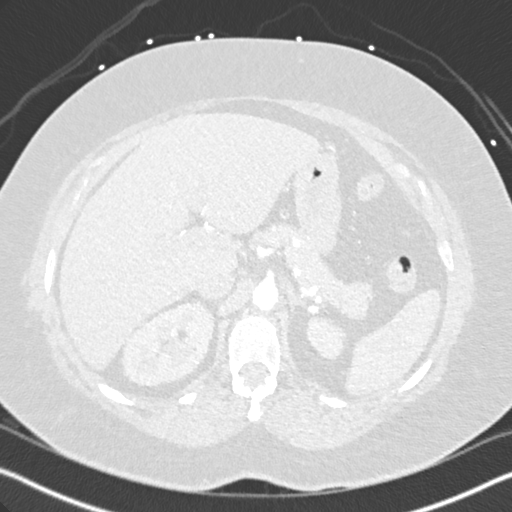
[im 33/291  soft-tissue]
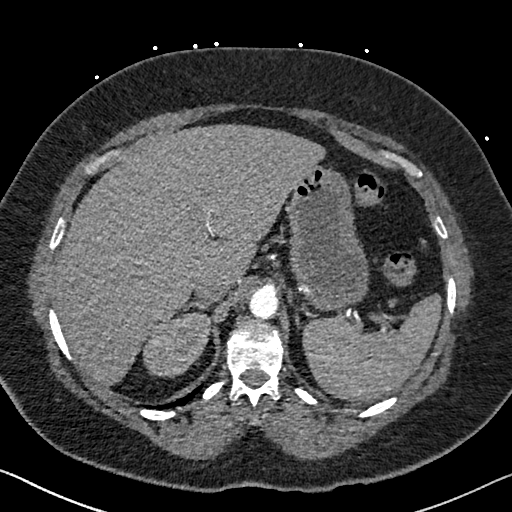
[im 49/291  lung]
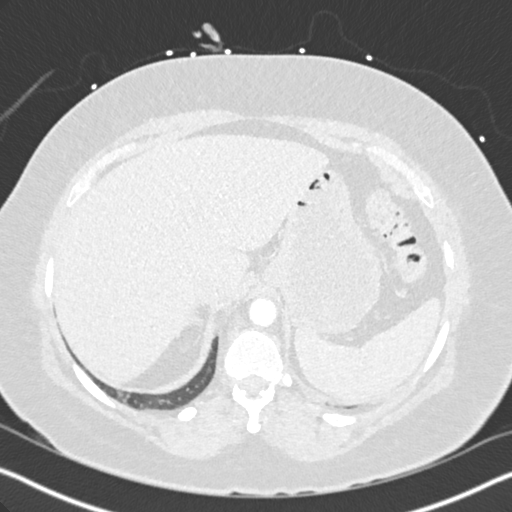
[im 65/291  soft-tissue]
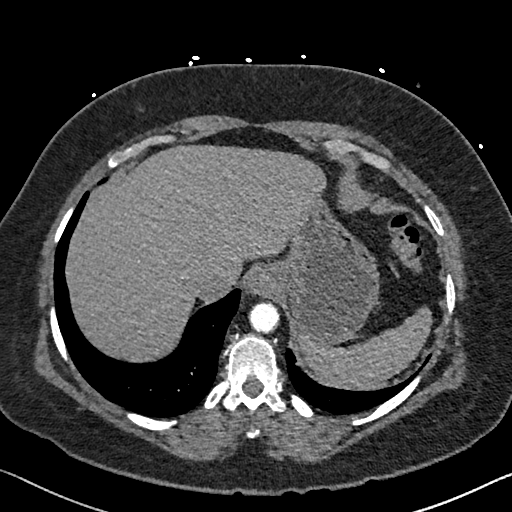
[im 81/291  lung]
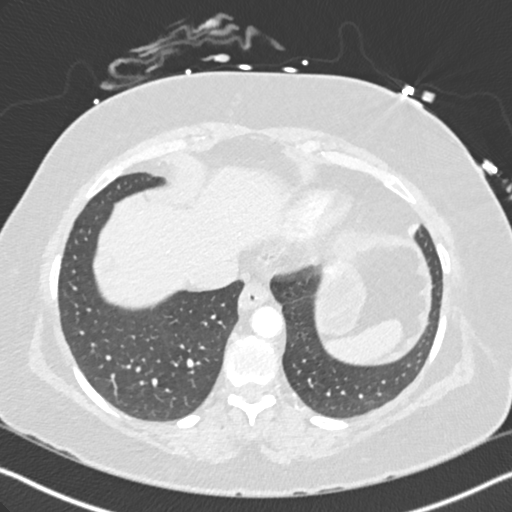
[im 97/291  soft-tissue]
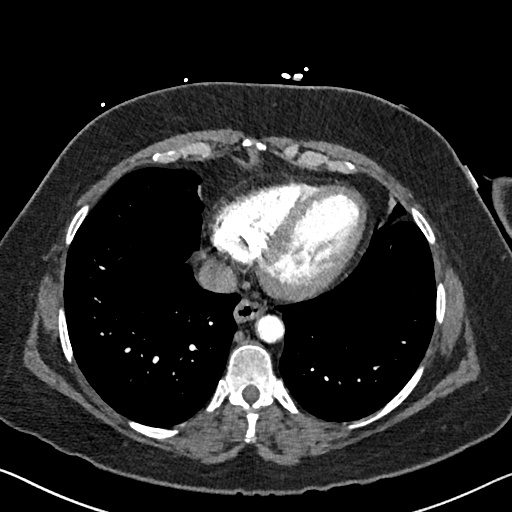
[im 113/291  lung]
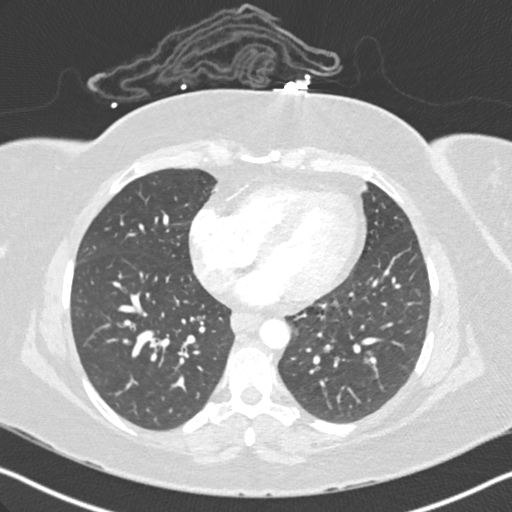
[im 129/291  soft-tissue]
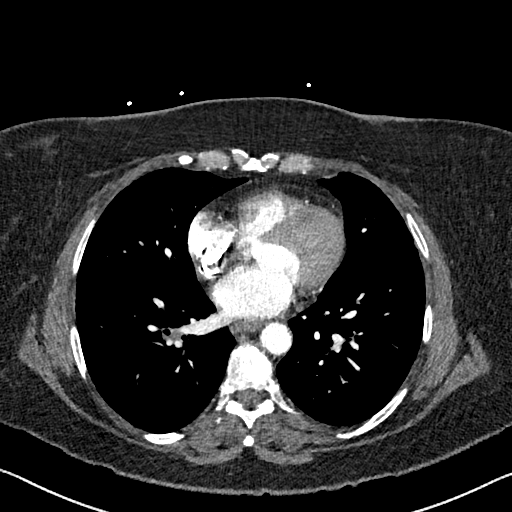
[im 162/291  lung]
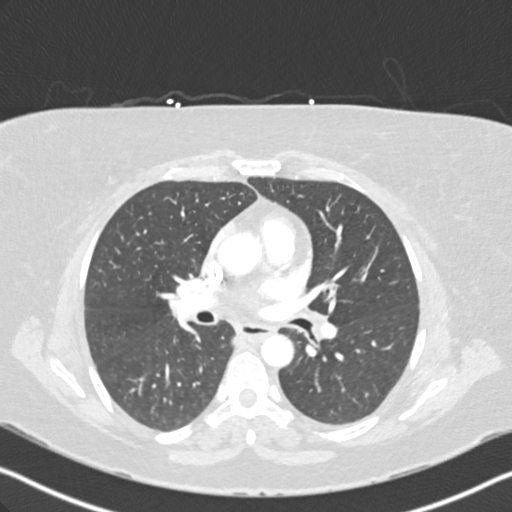
[im 178/291  soft-tissue]
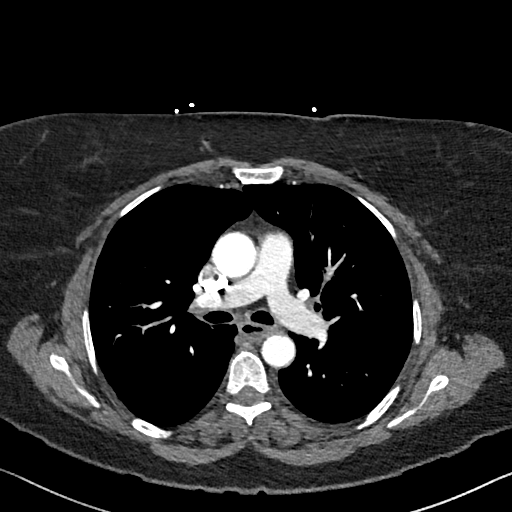
[im 194/291  lung]
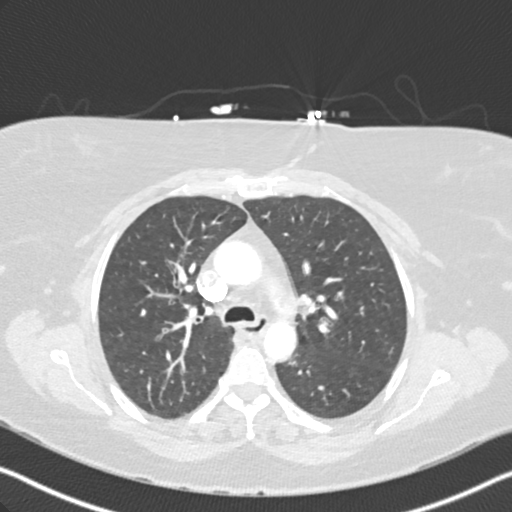
[im 210/291  soft-tissue]
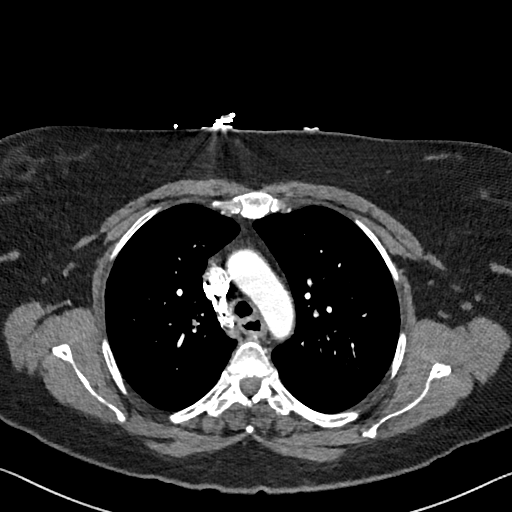
[im 226/291  lung]
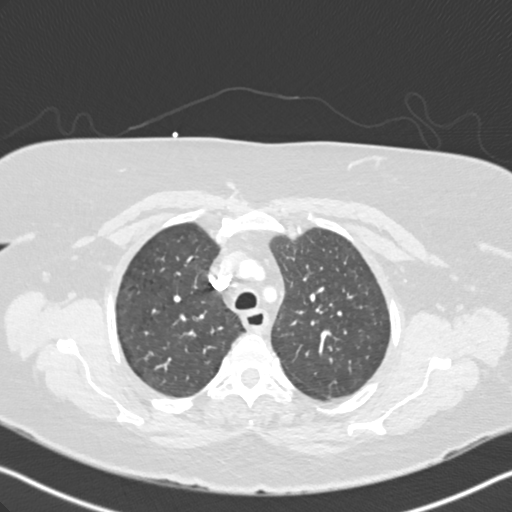
[im 242/291  soft-tissue]
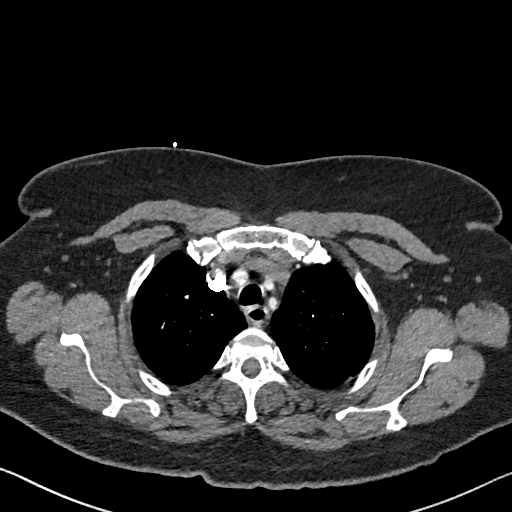
[im 258/291  lung]
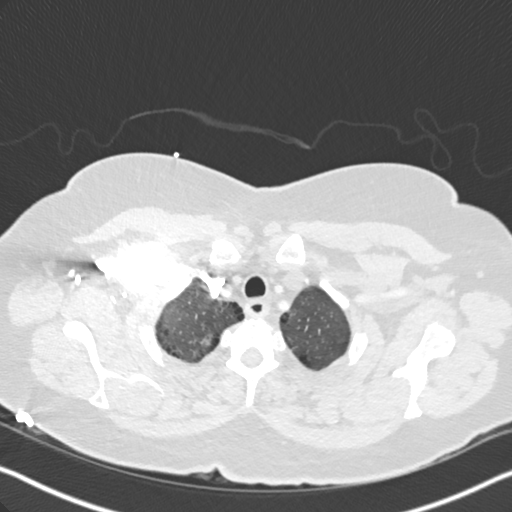
[im 274/291  soft-tissue]
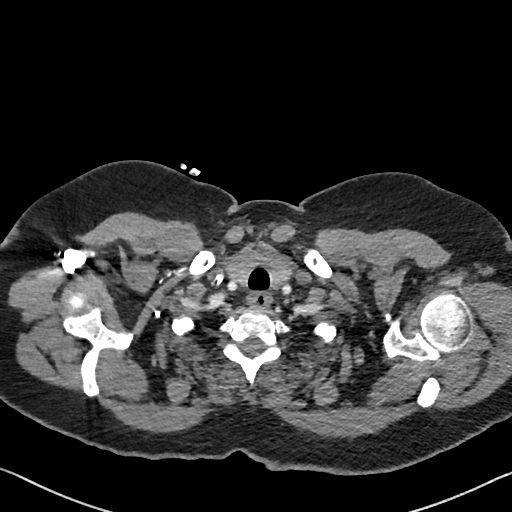

[Series 7: coronal mpr · coronal · 0.59mm/px · 3 of 125 slices shown]
[im 32/125  soft-tissue]
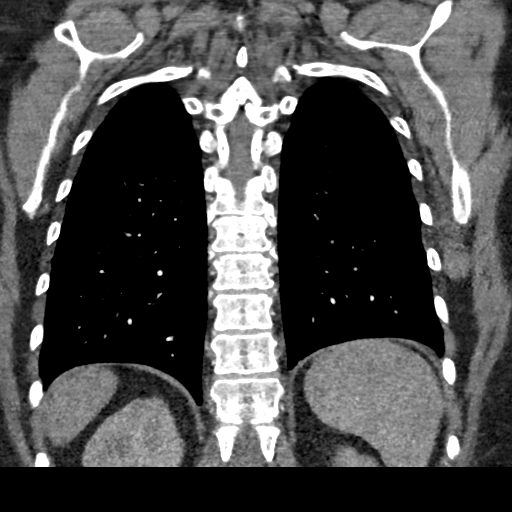
[im 63/125  soft-tissue]
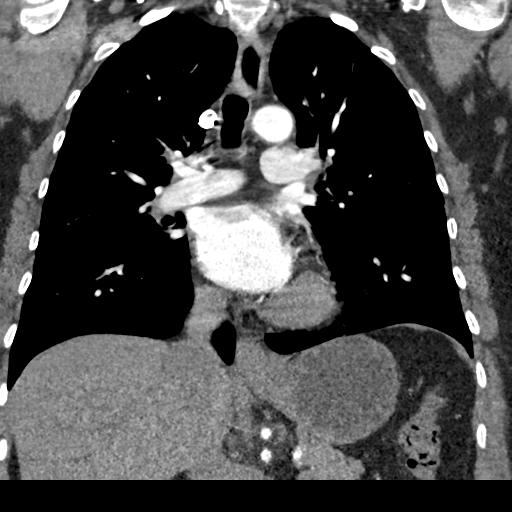
[im 94/125  soft-tissue]
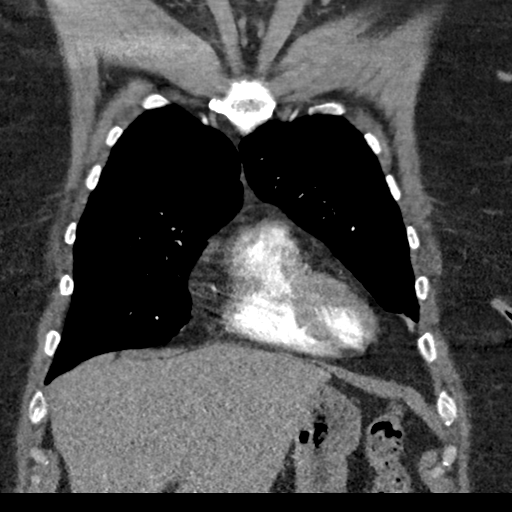

[19 of 46 positions shown; findings below may reference images not displayed]

FINDINGS: Lungs/Pleura: Mild paraseptal emphysema. Clear lungs. Lower lobe
predominant bronchial wall thickening. No pleural fluid.

Heart/Mediastinum: The quality of this examination for evaluation of
pulmonary embolism is moderate. The bolus is suboptimally timed,
with contrast centered in the thoracic aorta. No pulmonary embolism
to the segmental level. Smaller emboli cannot be excluded.

Age advanced aortic and branch vessel atherosclerosis. Normal heart
size, without pericardial effusion. No mediastinal or hilar
adenopathy. Fluid level in the thoracic esophagus on image 40/series
4.

Upper Abdomen:  No significant findings.

Bones/Musculoskeletal:  No acute osseous abnormality.

Review of the MIP images confirms the above findings.
IMPRESSION: 1. Suboptimal bolus timing. No evidence of pulmonary embolism to the
large segmental level.
2. Paraseptal emphysema with bronchial wall thickening, likely
related to smoking.
3. Age advanced atherosclerosis. Consider cardiac/coronary source
for patient's chest pain.
4. Esophageal air fluid level suggests dysmotility or
gastroesophageal reflux.
